# Patient Record
Sex: Male | Born: 1970 | Race: White | Hispanic: No | Marital: Single | Smoking: Current every day smoker
Health system: Southern US, Community
[De-identification: ages and names within clinical notes are randomized; demographics above are authoritative.]

## PROBLEM LIST (undated history)

## (undated) DIAGNOSIS — M1990 Unspecified osteoarthritis, unspecified site: Secondary | ICD-10-CM

## (undated) HISTORY — PX: APPENDECTOMY: SHX54

---

## 1999-12-25 ENCOUNTER — Ambulatory Visit (HOSPITAL_BASED_OUTPATIENT_CLINIC_OR_DEPARTMENT_OTHER): Admission: RE | Admit: 1999-12-25 | Discharge: 1999-12-25 | Payer: Self-pay | Admitting: *Deleted

## 2003-07-14 ENCOUNTER — Emergency Department (HOSPITAL_COMMUNITY): Admission: EM | Admit: 2003-07-14 | Discharge: 2003-07-14 | Payer: Self-pay | Admitting: Emergency Medicine

## 2014-04-07 ENCOUNTER — Emergency Department (HOSPITAL_COMMUNITY)
Admission: EM | Admit: 2014-04-07 | Discharge: 2014-04-07 | Disposition: A | Discharge status: Nursing Home (Includes ICF) | Payer: 59 | Source: Home / Self Care | Attending: Family Medicine | Admitting: Family Medicine

## 2014-04-07 ENCOUNTER — Encounter (HOSPITAL_COMMUNITY): Payer: Self-pay | Admitting: Emergency Medicine

## 2014-04-07 ENCOUNTER — Emergency Department (HOSPITAL_COMMUNITY)
Admission: EM | Admit: 2014-04-07 | Discharge: 2014-04-07 | Disposition: A | Discharge status: Home / Self Care | Payer: 59 | Attending: Emergency Medicine | Admitting: Emergency Medicine

## 2014-04-07 ENCOUNTER — Emergency Department (HOSPITAL_COMMUNITY): Payer: 59

## 2014-04-07 DIAGNOSIS — R918 Other nonspecific abnormal finding of lung field: Secondary | ICD-10-CM | POA: Insufficient documentation

## 2014-04-07 DIAGNOSIS — Z79899 Other long term (current) drug therapy: Secondary | ICD-10-CM | POA: Insufficient documentation

## 2014-04-07 DIAGNOSIS — M129 Arthropathy, unspecified: Secondary | ICD-10-CM | POA: Insufficient documentation

## 2014-04-07 DIAGNOSIS — F17200 Nicotine dependence, unspecified, uncomplicated: Secondary | ICD-10-CM | POA: Insufficient documentation

## 2014-04-07 DIAGNOSIS — R079 Chest pain, unspecified: Secondary | ICD-10-CM

## 2014-04-07 DIAGNOSIS — Z791 Long term (current) use of non-steroidal anti-inflammatories (NSAID): Secondary | ICD-10-CM | POA: Insufficient documentation

## 2014-04-07 DIAGNOSIS — R0789 Other chest pain: Secondary | ICD-10-CM | POA: Insufficient documentation

## 2014-04-07 DIAGNOSIS — IMO0002 Reserved for concepts with insufficient information to code with codable children: Secondary | ICD-10-CM | POA: Insufficient documentation

## 2014-04-07 DIAGNOSIS — R209 Unspecified disturbances of skin sensation: Secondary | ICD-10-CM | POA: Insufficient documentation

## 2014-04-07 DIAGNOSIS — R9389 Abnormal findings on diagnostic imaging of other specified body structures: Secondary | ICD-10-CM

## 2014-04-07 HISTORY — DX: Unspecified osteoarthritis, unspecified site: M19.90

## 2014-04-07 LAB — CBC
HCT: 41.2 % (ref 39.0–52.0)
Hemoglobin: 14.5 g/dL (ref 13.0–17.0)
MCH: 31.9 pg (ref 26.0–34.0)
MCHC: 35.2 g/dL (ref 30.0–36.0)
MCV: 90.7 fL (ref 78.0–100.0)
Platelets: 211 10*3/uL (ref 150–400)
RBC: 4.54 MIL/uL (ref 4.22–5.81)
RDW: 13.5 % (ref 11.5–15.5)
WBC: 6.5 10*3/uL (ref 4.0–10.5)

## 2014-04-07 LAB — COMPREHENSIVE METABOLIC PANEL
ALBUMIN: 4.2 g/dL (ref 3.5–5.2)
ALT: 25 U/L (ref 0–53)
AST: 27 U/L (ref 0–37)
Alkaline Phosphatase: 42 U/L (ref 39–117)
BILIRUBIN TOTAL: 0.5 mg/dL (ref 0.3–1.2)
BUN: 22 mg/dL (ref 6–23)
CHLORIDE: 100 meq/L (ref 96–112)
CO2: 27 meq/L (ref 19–32)
Calcium: 9.2 mg/dL (ref 8.4–10.5)
Creatinine, Ser: 0.89 mg/dL (ref 0.50–1.35)
GFR calc Af Amer: >90 mL/min (ref >90)
Glucose, Bld: 89 mg/dL (ref 70–99)
POTASSIUM: 4.2 meq/L (ref 3.7–5.3)
SODIUM: 139 meq/L (ref 137–147)
Total Protein: 7.3 g/dL (ref 6.0–8.3)

## 2014-04-07 LAB — I-STAT TROPONIN, ED
Troponin i, poc: 0 ng/mL (ref 0.00–0.08)
Troponin i, poc: 0 ng/mL (ref 0.00–0.08)

## 2014-04-07 MED ORDER — PANTOPRAZOLE SODIUM 40 MG PO TBEC: 40.0000 mg | DELAYED_RELEASE_TABLET | Freq: Every day | ORAL | Status: AC

## 2014-04-07 MED ORDER — PANTOPRAZOLE SODIUM 40 MG PO TBEC
80.0000 mg | DELAYED_RELEASE_TABLET | Freq: Every day | ORAL | Status: DC
  Administered 2014-04-07: 80 mg via ORAL
  Filled 2014-04-07: qty 2

## 2014-04-07 NOTE — ED Provider Notes (Signed)
Alejandro Velasquez is a 43 y.o. male who presents to Urgent Care today for left chest tightness. Patient developed left chest tightness today at about 2:45 PM. He also noted left arm numbness at about the same time. He notes the pain is not exertional nor is it associated with palpitations or shortness of breath. He denies any sweating or nausea. The symptoms are neither worsened or alleviated by food or rest. He feels well otherwise. He denies any personal history of hypertension or hyperlipidemia.  He took aspirin at about 4 PM today. He cannot recall this is 81 mg or 325 mg  He denies any significant family history of early myocardial infarction.   History reviewed. No pertinent past medical history. History  Substance Use Topics  . Smoking status: Current Every Day Smoker -- 1.00 packs/day    Types: Cigarettes  . Smokeless tobacco: Not on file  . Alcohol Use: Yes   ROS as above Medications: No current facility-administered medications for this encounter.   No current outpatient prescriptions on file.    Exam:  BP 140/73  Pulse 60  Temp(Src) 98 F (36.7 C) (Oral)  Resp 18  SpO2 100% Gen: Well NAD HEENT: EOMI,  MMM Lungs: Normal work of breathing. CTABL Heart: RRR no MRG Abd: NABS, Soft. NT, ND Exts: Brisk capillary refill, warm and well perfused.   Twelve-lead EKG shows sinus bradycardia at 56 beats per minute. No abnormalities. No ST segment elevation or depression.   Assessment and Plan: 43 y.o. male with chest tightness with left arm numbness. This is concerning for acute coronary syndrome. I recommend transfer to ER via EMS with administration of nitroglycerin aspirin oxygen therapy and IV. Patient declines a services wishing to go directly to the emergency room.  As his EKG is normal and his pain is not typical for angina I feel this is reasonable, however it is against my medical advice. He expresses understanding and accepts that there is some risk by avoiding EMS  transfer.   Discussed warning signs or symptoms. Please see discharge instructions. Patient expresses understanding.    Rodolph BongEvan S Evangelene Vora, MD 04/07/14 [phone]

## 2014-04-07 NOTE — ED Notes (Signed)
Pt describes pressure type feeling in his left chest with numbness in left arm.  Denies any shortness of breath, diaphoresis or nausea.

## 2014-04-07 NOTE — ED Provider Notes (Signed)
CSN: [id number]     Arrival date & time 04/07/14  1759 History   First MD Initiated Contact with Patient 04/07/14 1837     Chief Complaint  Patient presents with  . Chest Pain      HPI 43 y.o. male who presents to Urgent Care today for left chest tightness. Patient developed left chest tightness today at about 2:45 PM. He also noted left arm numbness at about the same time. He notes the pain is not exertional nor is it associated with palpitations or shortness of breath. He denies any sweating or nausea. The symptoms are neither worsened or alleviated by food or rest. He feels well otherwise. He denies any personal history of hypertension or hyperlipidemia.  Past Medical History  Diagnosis Date  . Arthritis    Past Surgical History  Procedure Laterality Date  . Appendectomy     No family history on file. History  Substance Use Topics  . Smoking status: Current Every Day Smoker -- 1.00 packs/day    Types: Cigarettes  . Smokeless tobacco: Not on file  . Alcohol Use: Yes     Comment: 4 drinks per week    Review of Systems  All other systems reviewed and are negative  Allergies  Review of patient's allergies indicates no known allergies.  Home Medications   Prior to Admission medications   Medication Sig Start Date End Date Taking? Authorizing Provider  Halcinonide (HALOG) 0.1 % CREA Apply 1 application topically daily.   Yes Historical Provider, MD  meloxicam (MOBIC) 15 MG tablet Take 15 mg by mouth daily.   Yes Historical Provider, MD  Omega-3 Fatty Acids (FISH OIL) 1000 MG CAPS Take 1,000 mg by mouth daily.   Yes Historical Provider, MD   BP 128/75  Pulse 59  Temp(Src) 98 F (36.7 C) (Oral)  Resp 12  Ht 5' 8.5" (1.74 m)  Wt 189 lb (85.73 kg)  BMI 28.32 kg/m2  SpO2 99% Physical Exam  Nursing note and vitals reviewed. Constitutional: He is oriented to person, place, and time. He appears well-developed and well-nourished. No distress.  HENT:  Head: Normocephalic  and atraumatic.  Eyes: Pupils are equal, round, and reactive to light.  Neck: Normal range of motion.  Cardiovascular: Normal rate and intact distal pulses.   Pulmonary/Chest: No respiratory distress.  Abdominal: Normal appearance. He exhibits no distension. There is no tenderness. There is no rebound.  Musculoskeletal: Normal range of motion.  Neurological: He is alert and oriented to person, place, and time. No cranial nerve deficit.  Skin: Skin is warm and dry. No rash noted.  Psychiatric: He has a normal mood and affect. His behavior is normal.    ED Course  Procedures (including critical care time) Labs Review Labs Reviewed  CBC  COMPREHENSIVE METABOLIC PANEL  I-STAT TROPOININ, ED  I-STAT TROPOININ, ED   Heart score = 3   Imaging Review Dg Chest 2 View  04/07/2014   CLINICAL DATA:  Chest pain, cough  EXAM: CHEST  2 VIEW  COMPARISON:  None.  FINDINGS: Possible faint patchy right upper lobe opacity, possibly reflecting right upper lobe pneumonia, although equivocal. Osseous opacity related to the right anterior 3rd rib is also possible.  Lungs are otherwise clear.  No pleural effusion or pneumothorax.  The heart is normal in size.  Visualized osseous structures are within normal limits.  IMPRESSION: Possible faint patchy right upper lobe opacity, equivocal, pneumonia not excluded.   Electronically Signed   By: Charline BillsSriyesh  Krishnan  M.D.   On: 04/07/2014 19:25     EKG Interpretation   Date/Time:  Friday Apr 07 2014 18:05:01 EDT Ventricular Rate:  57 PR Interval:  138 QRS Duration: 106 QT Interval:  404 QTC Calculation: 393 R Axis:   62 Text Interpretation:  Sinus bradycardia Incomplete right bundle branch  block Borderline ECG No significant change since last tracing Confirmed by  Jannah Guardiola  MD, Quin Mathenia ([id number]) on 04/07/2014 9:23:52 PM      MDM   Final diagnoses:  Chest pressure  Abnormal chest x-ray        Nelia Shiobert L Hendry Speas, MD 04/07/14 2129

## 2014-04-07 NOTE — Discharge Instructions (Signed)
Chest Pain (Nonspecific) °It is often hard to give a specific diagnosis for the cause of chest pain. There is always a chance that your pain could be related to something serious, such as a heart attack or a blood clot in the lungs. You need to follow up with your caregiver for further evaluation. °CAUSES  °· Heartburn. °· Pneumonia or bronchitis. °· Anxiety or stress. °· Inflammation around your heart (pericarditis) or lung (pleuritis or pleurisy). °· A blood clot in the lung. °· A collapsed lung (pneumothorax). It can develop suddenly on its own (spontaneous pneumothorax) or from injury (trauma) to the chest. °· Shingles infection (herpes zoster virus). °The chest wall is composed of bones, muscles, and cartilage. Any of these can be the source of the pain. °· The bones can be bruised by injury. °· The muscles or cartilage can be strained by coughing or overwork. °· The cartilage can be affected by inflammation and become sore (costochondritis). °DIAGNOSIS  °Lab tests or other studies, such as X-rays, electrocardiography, stress testing, or cardiac imaging, may be needed to find the cause of your pain.  °TREATMENT  °· Treatment depends on what may be causing your chest pain. Treatment may include: °· Acid blockers for heartburn. °· Anti-inflammatory medicine. °· Pain medicine for inflammatory conditions. °· Antibiotics if an infection is present. °· You may be advised to change lifestyle habits. This includes stopping smoking and avoiding alcohol, caffeine, and chocolate. °· You may be advised to keep your head raised (elevated) when sleeping. This reduces the chance of acid going backward from your stomach into your esophagus. °· Most of the time, nonspecific chest pain will improve within 2 to 3 days with rest and mild pain medicine. °HOME CARE INSTRUCTIONS  °· If antibiotics were prescribed, take your antibiotics as directed. Finish them even if you start to feel better. °· For the next few days, avoid physical  activities that bring on chest pain. Continue physical activities as directed. °· Do not smoke. °· Avoid drinking alcohol. °· Only take over-the-counter or prescription medicine for pain, discomfort, or fever as directed by your caregiver. °· Follow your caregiver's suggestions for further testing if your chest pain does not go away. °· Keep any follow-up appointments you made. If you do not go to an appointment, you could develop lasting (chronic) problems with pain. If there is any problem keeping an appointment, you must call to reschedule. °SEEK MEDICAL CARE IF:  °· You think you are having problems from the medicine you are taking. Read your medicine instructions carefully. °· Your chest pain does not go away, even after treatment. °· You develop a rash with blisters on your chest. °SEEK IMMEDIATE MEDICAL CARE IF:  °· You have increased chest pain or pain that spreads to your arm, neck, jaw, back, or abdomen. °· You develop shortness of breath, an increasing cough, or you are coughing up blood. °· You have severe back or abdominal pain, feel nauseous, or vomit. °· You develop severe weakness, fainting, or chills. °· You have a fever. °THIS IS AN EMERGENCY. Do not wait to see if the pain will go away. Get medical help at once. Call your local emergency services (911 in [place] Do not drive yourself to the hospital. °MAKE SURE YOU:  °· Understand these instructions. °· Will watch your condition. °· Will get help right away if you are not doing well or get worse. °Document Released: 09/03/2005 Document Revised: 02/16/2012 Document Reviewed: 06/29/2008 °Exit[employer]® Patient Information ©2014 [employer]   [employer]. ° °

## 2014-04-07 NOTE — ED Notes (Signed)
Pt reports chest tightness onset 1445 Sx also include left arm numbness Denies inj/trauma, SOB, HA, diaphoresis, weakness Smokes 1 PPD Alert and talking in complete sentences w/no signs of acute disterss

## 2014-04-07 NOTE — ED Notes (Signed)
Pt sent here from UC for further eval of chest pain.  Acute onset L chest pain and L arm tightness today.  Pain is continues.  Pt denies sob or nausea and is not diaphoretic.

## 2014-04-07 NOTE — ED Notes (Signed)
Patient returned from XR. 

## 2015-04-02 IMAGING — CR DG CHEST 2V
2 series · 2 of 2 positions shown · non-contrast
Comparison: None.

CLINICAL DATA: Chest pain, cough

EXAM:
CHEST  2 VIEW

[w chest pa]
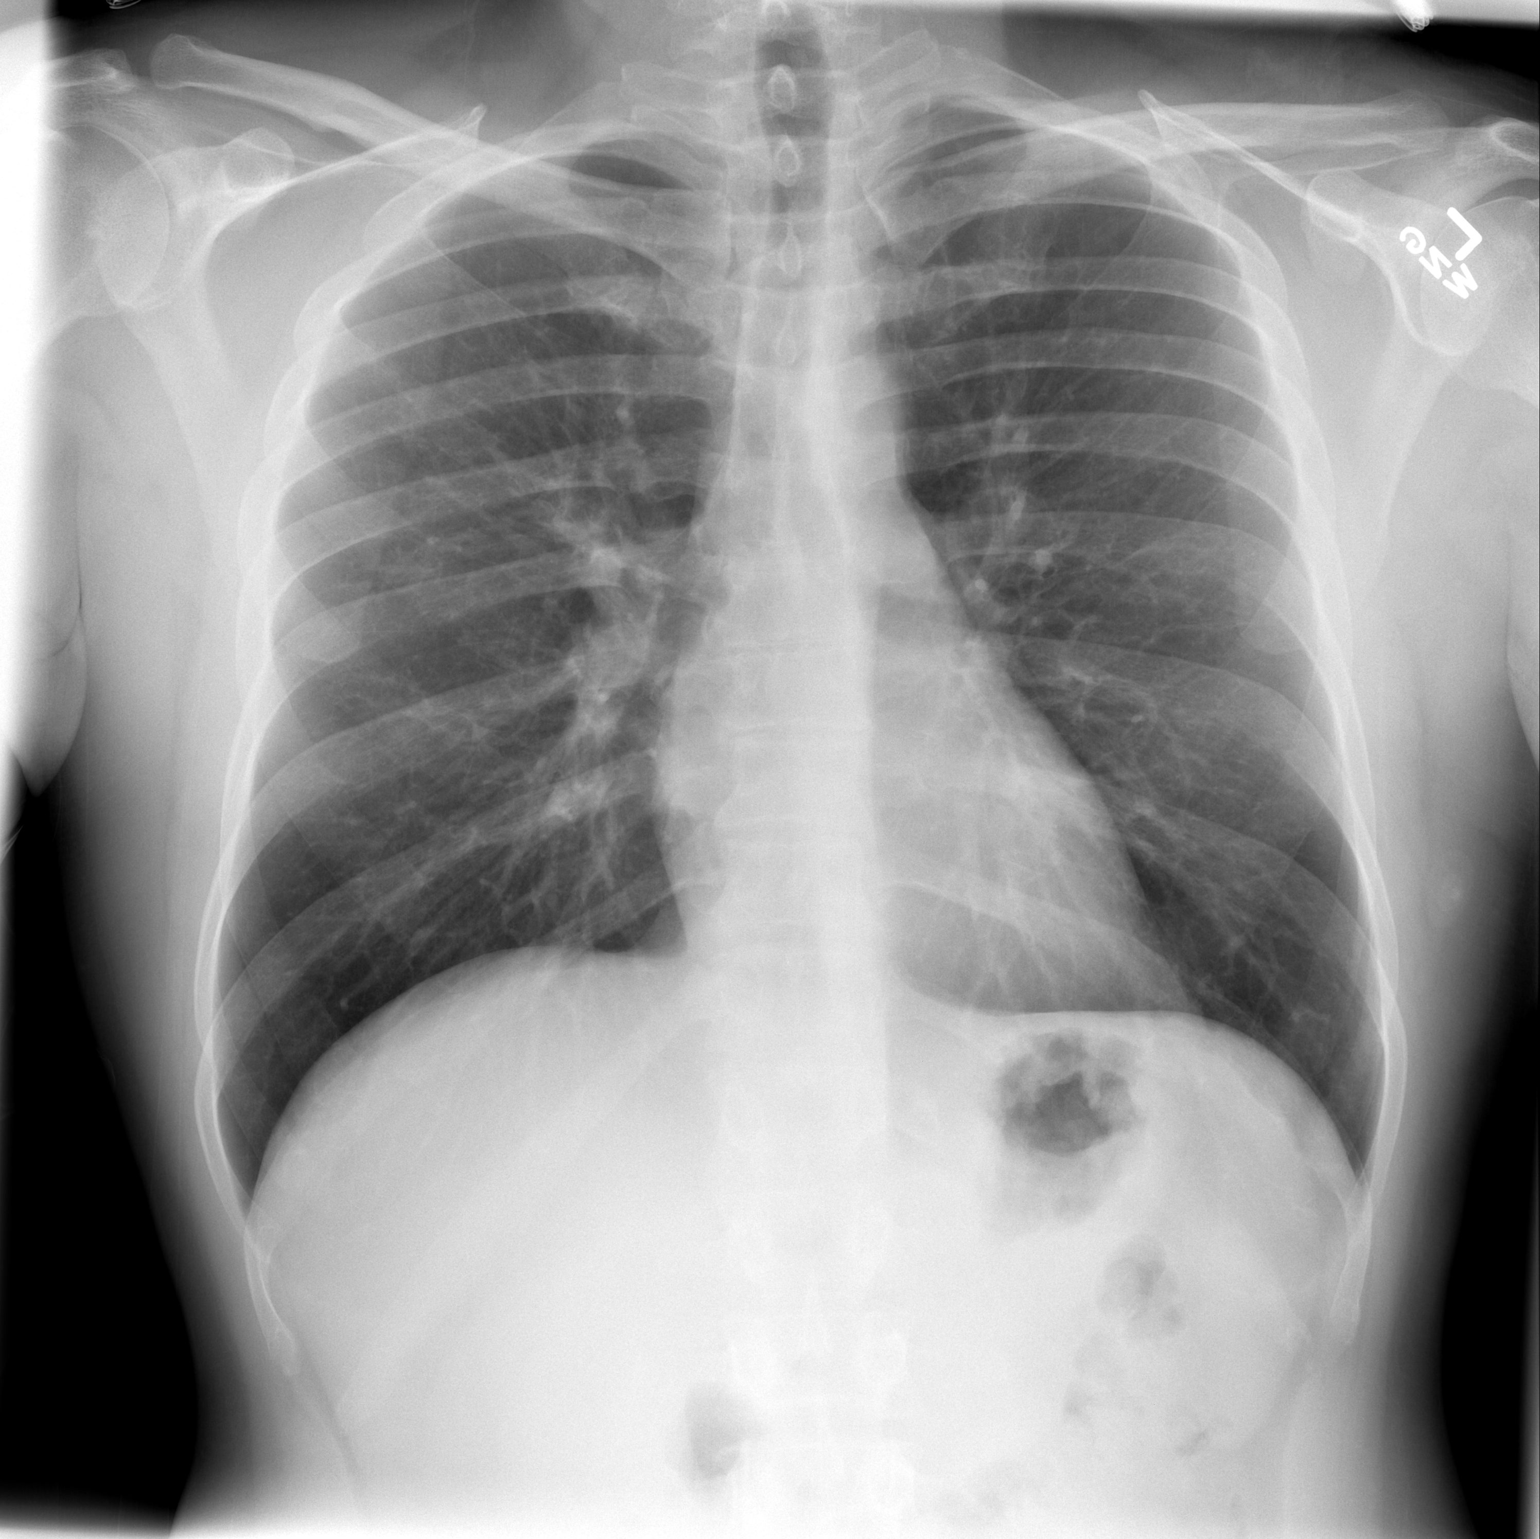

[w chest lat]
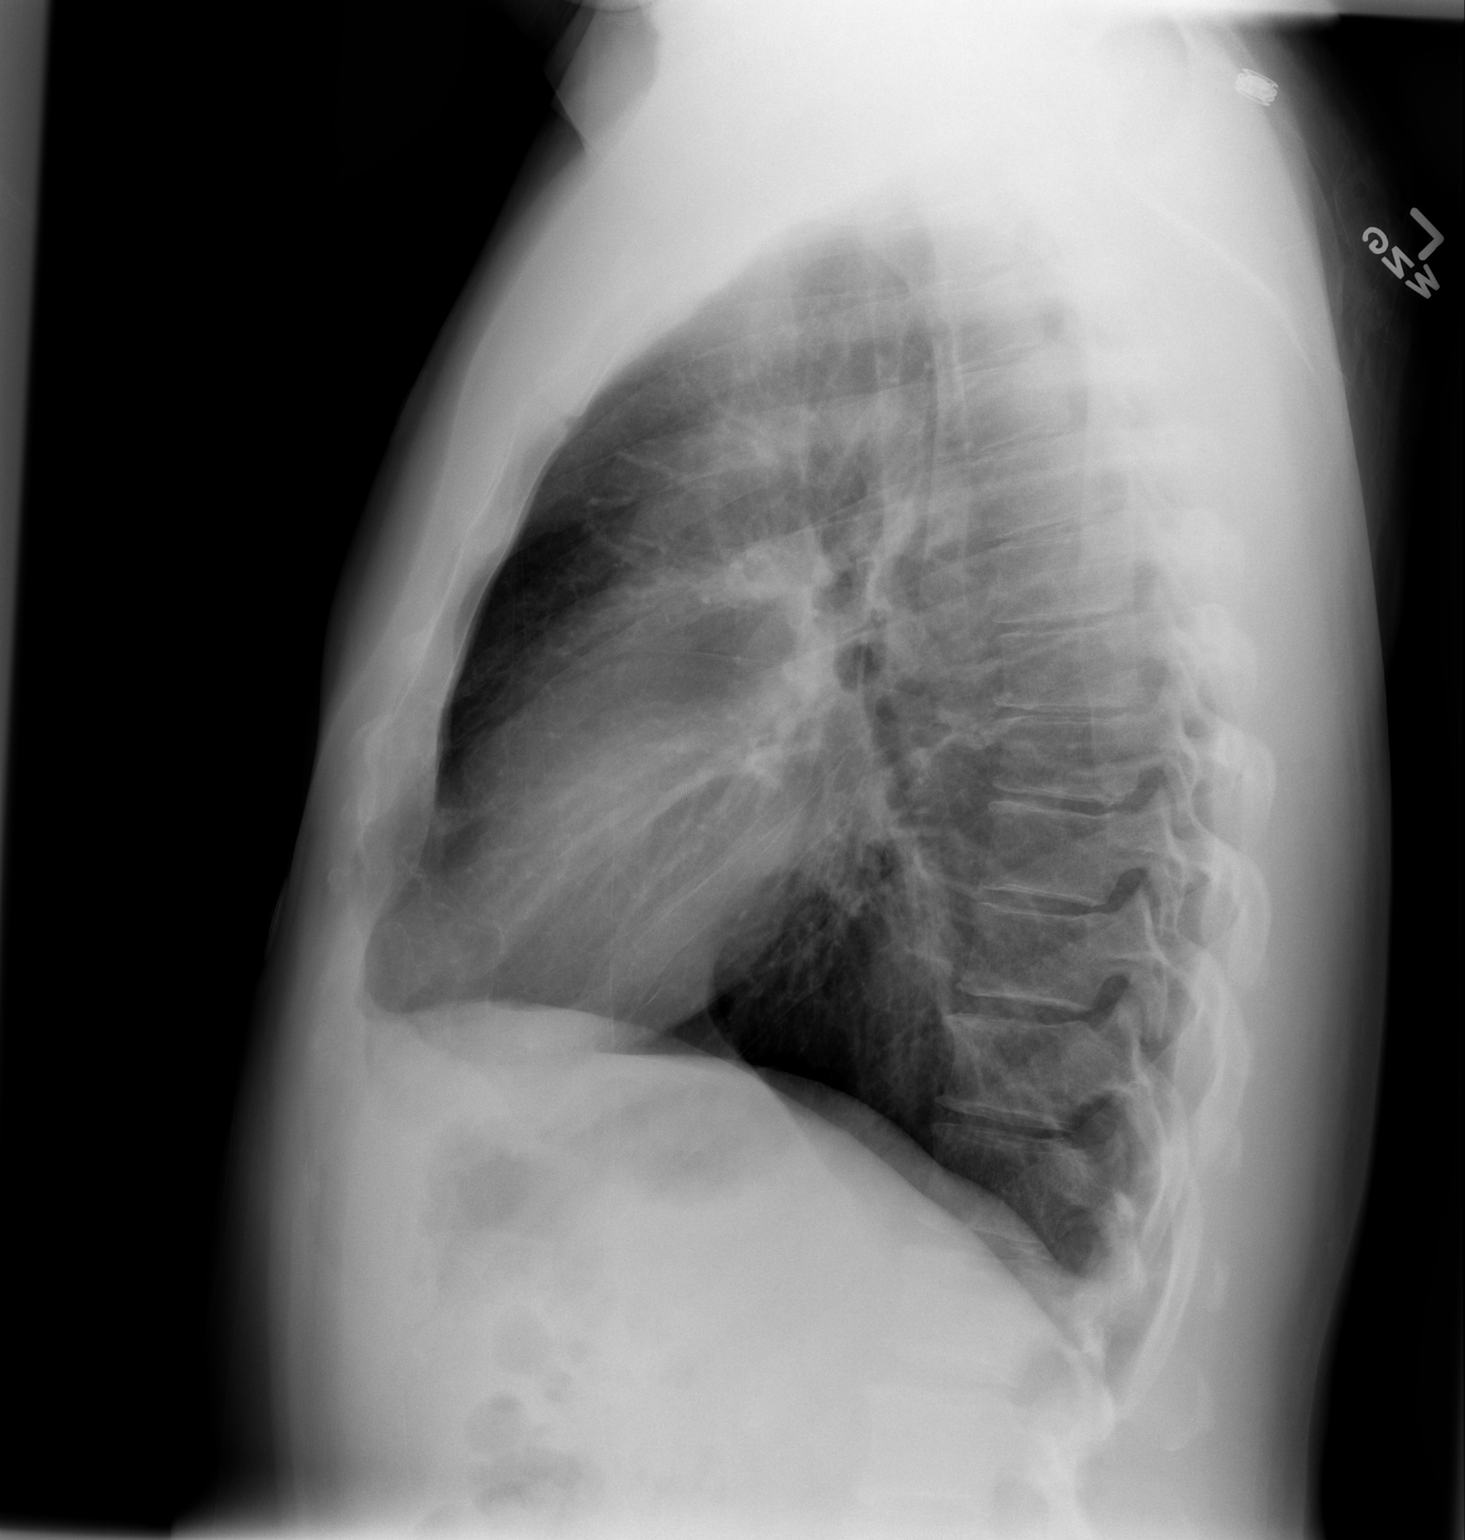

[2 of 2 positions shown; findings below may reference images not displayed]

FINDINGS: Possible faint patchy right upper lobe opacity, possibly reflecting
right upper lobe pneumonia, although equivocal. Osseous opacity
related to the right anterior 3rd rib is also possible.

Lungs are otherwise clear.  No pleural effusion or pneumothorax.

The heart is normal in size.

Visualized osseous structures are within normal limits.
IMPRESSION: Possible faint patchy right upper lobe opacity, equivocal, pneumonia
not excluded.

## 2017-02-11 DIAGNOSIS — M792 Neuralgia and neuritis, unspecified: Secondary | ICD-10-CM | POA: Diagnosis not present

## 2017-02-11 DIAGNOSIS — R5383 Other fatigue: Secondary | ICD-10-CM | POA: Diagnosis not present

## 2017-02-11 DIAGNOSIS — M791 Myalgia: Secondary | ICD-10-CM | POA: Diagnosis not present

## 2017-02-12 DIAGNOSIS — Z125 Encounter for screening for malignant neoplasm of prostate: Secondary | ICD-10-CM | POA: Diagnosis not present

## 2017-02-12 DIAGNOSIS — M792 Neuralgia and neuritis, unspecified: Secondary | ICD-10-CM | POA: Diagnosis not present

## 2017-02-12 DIAGNOSIS — R740 Nonspecific elevation of levels of transaminase and lactic acid dehydrogenase [LDH]: Secondary | ICD-10-CM | POA: Diagnosis not present

## 2017-02-12 DIAGNOSIS — R5383 Other fatigue: Secondary | ICD-10-CM | POA: Diagnosis not present

## 2017-02-12 DIAGNOSIS — M791 Myalgia: Secondary | ICD-10-CM | POA: Diagnosis not present

## 2017-02-19 DIAGNOSIS — R945 Abnormal results of liver function studies: Secondary | ICD-10-CM | POA: Diagnosis not present

## 2017-02-19 DIAGNOSIS — M792 Neuralgia and neuritis, unspecified: Secondary | ICD-10-CM | POA: Diagnosis not present

## 2017-02-19 DIAGNOSIS — M2550 Pain in unspecified joint: Secondary | ICD-10-CM | POA: Diagnosis not present

## 2018-04-26 DIAGNOSIS — K429 Umbilical hernia without obstruction or gangrene: Secondary | ICD-10-CM | POA: Diagnosis not present

## 2018-04-26 DIAGNOSIS — S80861A Insect bite (nonvenomous), right lower leg, initial encounter: Secondary | ICD-10-CM | POA: Diagnosis not present

## 2018-05-19 DIAGNOSIS — K429 Umbilical hernia without obstruction or gangrene: Secondary | ICD-10-CM | POA: Diagnosis not present

## 2018-05-19 DIAGNOSIS — Z720 Tobacco use: Secondary | ICD-10-CM | POA: Diagnosis not present

## 2018-08-02 DIAGNOSIS — J019 Acute sinusitis, unspecified: Secondary | ICD-10-CM | POA: Diagnosis not present

## 2018-12-04 DIAGNOSIS — J40 Bronchitis, not specified as acute or chronic: Secondary | ICD-10-CM | POA: Diagnosis not present

## 2018-12-04 DIAGNOSIS — J069 Acute upper respiratory infection, unspecified: Secondary | ICD-10-CM | POA: Diagnosis not present

## 2019-01-18 DIAGNOSIS — Z0000 Encounter for general adult medical examination without abnormal findings: Secondary | ICD-10-CM | POA: Diagnosis not present

## 2024-07-27 ENCOUNTER — Ambulatory Visit: Payer: Self-pay | Admitting: Dermatology

## 2024-10-05 NOTE — Progress Notes (Signed)
 I received a call from the radiologist regarding the patient's x-rays.  He does have a small fracture in the left wrist but the bigger concern are possible fractures in C3 and 4, anterior inferior vertebral body extension avulsion fractures.  There are some concern in C2-C3 left articular facet joint as well but due to patient's symptoms which have continued, bilateral arm and hand numbness, tingling and burning sensation, it was recommended he go onto the ER for immediate CT scan to confirm whether or not there are fractures present.  He can then be evaluated emergently by the appropriate specialists.  I relayed all of this information to the patient and he said he was able to get to the hospital ER right away. I will put a referral into the orthopedist regarding the wrist fracture.

## 2024-10-06 ENCOUNTER — Other Ambulatory Visit: Payer: Self-pay

## 2024-10-06 ENCOUNTER — Emergency Department (HOSPITAL_COMMUNITY): Admission: EM | Admit: 2024-10-06 | Discharge: 2024-10-06 | Disposition: A | Discharge status: Home / Self Care

## 2024-10-06 ENCOUNTER — Encounter (HOSPITAL_COMMUNITY): Payer: Self-pay

## 2024-10-06 ENCOUNTER — Emergency Department (HOSPITAL_COMMUNITY)

## 2024-10-06 DIAGNOSIS — M25532 Pain in left wrist: Secondary | ICD-10-CM | POA: Insufficient documentation

## 2024-10-06 DIAGNOSIS — S161XXA Strain of muscle, fascia and tendon at neck level, initial encounter: Secondary | ICD-10-CM | POA: Diagnosis not present

## 2024-10-06 DIAGNOSIS — S199XXA Unspecified injury of neck, initial encounter: Secondary | ICD-10-CM | POA: Diagnosis present

## 2024-10-06 DIAGNOSIS — W11XXXA Fall on and from ladder, initial encounter: Secondary | ICD-10-CM | POA: Insufficient documentation

## 2024-10-06 DIAGNOSIS — M25432 Effusion, left wrist: Secondary | ICD-10-CM | POA: Insufficient documentation

## 2024-10-06 NOTE — Discharge Instructions (Signed)
 Please wear soft cervical collar as needed for stability and support.  Continue to take ibuprofen, alternate between heat and ice for symptom control.  Fortunately CT scan today did not show any evidence of any broken bone in your cervical spine.

## 2024-10-06 NOTE — ED Provider Triage Note (Signed)
 Emergency Medicine Provider Triage Evaluation Note  Alejandro Velasquez , a 53 y.o. male  was evaluated in triage.  Pt complains of fall from a ladder at 4 feet on Tuesday.  Patient was seen at PCP on Wednesday and x-rays were performed and she advised to follow-up to the ER for CT cervical  for concern of fracture of C3-C4  Review of Systems  Positive: Neck pain Negative: Radiculopathy pain, decreased neck range of motion, chest pain, shortness of breath  Physical Exam  BP (!) 157/95 (BP Location: Right Arm)   Pulse (!) 103   Temp 98.1 F (36.7 C)   Resp 18   SpO2 97%  Gen:   Awake, no distress   Resp:  Normal effort  MSK:   Moves extremities without decreased range of motion, there is some midline tenderness on the cervical spine Other:    Medical Decision Making  Medically screening exam initiated at 2:42 PM.  Appropriate orders placed.  Alejandro Velasquez was informed that the remainder of the evaluation will be completed by another provider, this initial triage assessment does not replace that evaluation, and the importance of remaining in the ED until their evaluation is complete.  Patient fell off of the ladder from 4 feet on Tuesday.  Patient reports he caught himself on his hands and hit the back of his head.  Patient denies any dizziness, visual disturbances, blood thinners,.  Patient was seen at PCP with x-rays of wrist and spine.  PCP advised to go to the ED for CT of cervical spine due to concern for fracture of C3-C4.   Alejandro Velasquez, [place] 10/06/24 8554

## 2024-10-06 NOTE — ED Triage Notes (Signed)
 PT reports fall off of a ladder about 4-5 this past Tuesday. CMS intact. Pt c/o upper back pain, was told that a ct or mri is recommended. Pt reports baseline nerve issues in both hands, denies any worsening of numbness and tingling in hands since the fall. Pt is AxOx4, did not hit his head. No blood thinners.

## 2024-10-06 NOTE — ED Provider Notes (Signed)
 [place] [facility] AT [facility] Provider Note   CSN: [id number] Arrival date & time: 10/06/24  1200     Patient presents with: Back Pain   Abdifatah Velasquez is a 53 y.o. male.   The history is provided by the patient and medical records. No language interpreter was used.  Back Pain    53 year old male with history of arthritis presenting for evaluation of a fall.  Patient states he fell off a ladder 2 days ago.  States he was blowing off leaves from the roof and was stepping down from his ladder and missed stepped and fell approximately 4 to 5 foot and landed directly on his back and neck.  He is unsure if he has any loss of conscious.  Since the fall he noticed pain to his neck as well as pain to both of his wrist and right knee.  States that he has history of carpal tunnel syndrome which caused him to have tingling numbness to his wrist but he felt after the fall he noticed increased swelling to the left wrist with more noticeable tingling sensation.  He went to his PCP yesterday for evaluation and states he had x-rays of his neck, wrist, and knee.  He was contacted by his PCP today stating that the x-ray of his neck shows evidence of broken spine and for him to come to the ER for further evaluation.  He is right-hand dominant.  He has been taking ibuprofen at home for pain  Prior to Admission medications   Medication Sig Start Date End Date Taking? Authorizing Provider  Halcinonide (HALOG) 0.1 % CREA Apply 1 application topically daily.    [provider]  meloxicam (MOBIC) 15 MG tablet Take 15 mg by mouth daily.    [provider]  Omega-3 Fatty Acids (FISH OIL) 1000 MG CAPS Take 1,000 mg by mouth daily.    [provider]  pantoprazole  (PROTONIX ) 40 MG tablet Take 1 tablet (40 mg total) by mouth daily at 12 noon. 04/08/14   Lynnea Charleston, MD    Allergies: Patient has no known allergies.    Review of Systems  Musculoskeletal:  Positive  for back pain.  All other systems reviewed and are negative.   Updated Vital Signs BP (!) 144/97 (BP Location: Right Arm)   Pulse 90   Temp 98.9 F (37.2 C) (Oral)   Resp 18   SpO2 98%   Physical Exam Constitutional:      General: He is not in acute distress.    Appearance: He is well-developed.  HENT:     Head: Atraumatic.  Eyes:     Conjunctiva/sclera: Conjunctivae normal.  Neck:     Comments: Tenderness to cervical and paracervical spinal muscle without any crepitus or step-off.  Able to range his neck. Cardiovascular:     Rate and Rhythm: Normal rate and regular rhythm.  Musculoskeletal:        General: Tenderness (Tenderness to left wrist with some swelling noted but able to flex and extend the wrist with intact radial pulse and intact sensation.) present.     Cervical back: Normal range of motion and neck supple.  Skin:    Findings: No rash.  Neurological:     Mental Status: He is alert and oriented to person, place, and time.     (all labs ordered are listed, but only abnormal results are displayed) Labs Reviewed - No data to display  EKG: None  Radiology: CT Cervical Spine  Wo Contrast Result Date: 10/06/2024 CLINICAL DATA:  Neck trauma, midline tenderness (Age 43-64y) Fall from ladder 2 days ago.  Neck and upper back pain. EXAM: CT CERVICAL SPINE WITHOUT CONTRAST TECHNIQUE: Multidetector CT imaging of the cervical spine was performed without intravenous contrast. Multiplanar CT image reconstructions were also generated. RADIATION DOSE REDUCTION: This exam was performed according to the departmental dose-optimization program which includes automated exposure control, adjustment of the mA and/or kV according to patient size and/or use of iterative reconstruction technique. COMPARISON:  None Available. FINDINGS: Alignment: Reversal of the usual cervical lordosis. No focal angulation or listhesis. Skull base and vertebrae: No evidence of acute cervical spine fracture or  traumatic subluxation. Soft tissues and spinal canal: No prevertebral fluid or swelling. No visible canal hematoma. Disc levels: Multilevel spondylosis with uncinate spurring greatest at C4-5 and C5-6. There is the potential for mild spinal stenosis and mild foraminal narrowing at multiple levels, although no large disc herniation identified. Upper chest: Clear lung apices. Other: None. IMPRESSION: 1. No evidence of acute cervical spine fracture, traumatic subluxation or static signs of instability. 2. Multilevel cervical spondylosis as described. Electronically Signed   By: Elsie Perone M.D.   On: 10/06/2024 15:18     Procedures   Medications Ordered in the ED - No data to display                                  Medical Decision Making  BP (!) 144/97 (BP Location: Right Arm)   Pulse 90   Temp 98.9 F (37.2 C) (Oral)   Resp 18   SpO2 98%   71:49 PM  53 year old male with history of arthritis presenting for evaluation of a fall.  Patient states he fell off a ladder 2 days ago.  States he was blowing off leaves from the roof and was stepping down from his ladder and missed stepped and fell approximately 4 to 5 foot and landed directly on his back and neck.  He is unsure if he has any loss of conscious.  Since the fall he noticed pain to his neck as well as pain to both of his wrist and right knee.  States that he has history of carpal tunnel syndrome which caused him to have tingling numbness to his wrist but he felt after the fall he noticed increased swelling to the left wrist with more noticeable tingling sensation.  He went to his PCP yesterday for evaluation and states he had x-rays of his neck, wrist, and knee.  He was contacted by his PCP today stating that the x-ray of his neck shows evidence of broken spine and for him to come to the ER for further evaluation.  He is right-hand dominant.  He has been taking ibuprofen at home for pain  Exam notable for tenderness to the cervical spine  and paracervical spinal muscle as well as tenderness to left wrist with some swelling noted.  Range of motion is intact.  CT scan of cervical spine was obtained independent viewed interpreted by me and fortunately no evidence of any acute cervical spine fracture or subluxation.  Patient does have multilevel cervical spondylosis that was noted.  I discussed this with patient.  Will provide patient with a soft cervical collar for support.  Although patient may have ligamentous injury I do not think patient need MRI at this time as the left wrist pain and swelling is likely due  to mechanical trauma from his fall.  His wrist is swollen.  He has had x-rays done outpatient for which I had reviewed through EMR and it was negative for acute fracture.  He does have an appointment with orthopedist specialist for his left wrist in several days.  He does have history of carpal tunnel syndrome and does wear wrist brace at home.     Final diagnoses:  Acute strain of neck muscle, initial encounter    ED Discharge Orders     None          Nivia Colon, PA-C 10/06/24 1748    Simon Lavonia SAILOR, MD 10/07/24 [place]

## 2024-10-06 NOTE — ED Triage Notes (Signed)
 QUICK TRIAGE: Pt states he fell off a ladder on Tuesday and had xrays which show fractured vertebrae, states he needs a CT scan or MRI or something.
